# Patient Record
Sex: Male | Born: 2000 | Race: Black or African American | Hispanic: No | Marital: Single | Smoking: Never smoker
Health system: Southern US, Community
[De-identification: ages and names within clinical notes are randomized; demographics above are authoritative.]

## PROBLEM LIST (undated history)

## (undated) ENCOUNTER — Emergency Department (HOSPITAL_COMMUNITY): Admission: EM | Payer: Medicaid Other | Source: Home / Self Care

## (undated) DIAGNOSIS — T7840XA Allergy, unspecified, initial encounter: Secondary | ICD-10-CM

## (undated) HISTORY — DX: Allergy, unspecified, initial encounter: T78.40XA

---
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
## 2020-03-14 ENCOUNTER — Other Ambulatory Visit: Payer: Self-pay

## 2020-03-14 ENCOUNTER — Emergency Department (HOSPITAL_COMMUNITY)
Admission: EM | Admit: 2020-03-14 | Discharge: 2020-03-14 | Disposition: A | Discharge status: Home / Self Care | Payer: Medicaid Other | Attending: Emergency Medicine | Admitting: Emergency Medicine

## 2020-03-14 ENCOUNTER — Encounter (HOSPITAL_COMMUNITY): Payer: Self-pay | Admitting: *Deleted

## 2020-03-14 DIAGNOSIS — M791 Myalgia, unspecified site: Secondary | ICD-10-CM | POA: Diagnosis present

## 2020-03-14 DIAGNOSIS — R05 Cough: Secondary | ICD-10-CM | POA: Diagnosis not present

## 2020-03-14 DIAGNOSIS — Z5321 Procedure and treatment not carried out due to patient leaving prior to being seen by health care provider: Secondary | ICD-10-CM | POA: Diagnosis not present

## 2020-03-14 DIAGNOSIS — R509 Fever, unspecified: Secondary | ICD-10-CM | POA: Insufficient documentation

## 2020-03-14 NOTE — ED Triage Notes (Signed)
Pt states he thinks he had covid x 2 days ago with muscle aches and cough and fever; pt denies any sx now

## 2021-06-30 ENCOUNTER — Other Ambulatory Visit: Payer: Self-pay

## 2021-06-30 ENCOUNTER — Ambulatory Visit
Admission: EM | Admit: 2021-06-30 | Discharge: 2021-06-30 | Disposition: A | Discharge status: Home / Self Care | Payer: Medicaid Other | Attending: Emergency Medicine | Admitting: Emergency Medicine

## 2021-06-30 DIAGNOSIS — S46912A Strain of unspecified muscle, fascia and tendon at shoulder and upper arm level, left arm, initial encounter: Secondary | ICD-10-CM

## 2021-06-30 DIAGNOSIS — S46812A Strain of other muscles, fascia and tendons at shoulder and upper arm level, left arm, initial encounter: Secondary | ICD-10-CM

## 2021-06-30 MED ORDER — IBUPROFEN 600 MG PO TABS: 600.0000 mg | ORAL_TABLET | Freq: Four times a day (QID) | ORAL | 0 refills | Status: AC | PRN

## 2021-06-30 MED ORDER — TIZANIDINE HCL 4 MG PO TABS: 4.0000 mg | ORAL_TABLET | Freq: Three times a day (TID) | ORAL | 0 refills | Status: AC | PRN

## 2021-06-30 NOTE — Discharge Instructions (Signed)
Take 1000 mg of Tylenol combined with 600 mg of ibuprofen, ice, Zanaflex for muscle spasms, stretching, deep tissue massage.

## 2021-06-30 NOTE — ED Triage Notes (Signed)
Patient presents to Urgent Care with complaints of chronic collar bone pain today. Pt states this is an on-going issue. He broke his collar bone 5 years ago. Not treating pain with meds.

## 2021-06-30 NOTE — ED Provider Notes (Signed)
HPI  SUBJECTIVE:  [name] [name] is a right-handed 20 y.o. male who presents with distal left clavicle pain starting today at 1600 after carrying his son.  He has had symptoms like this before after fracturing his clavicle 5 years ago.  He states that he has had intermittent pain in this area since.  No recent trauma, shoulder deformity, limitation of motion of the shoulder, change in physical activity, distal numbness or tingling, grip weakness, neck, trapezial pain.  He has tried moving his arm around without improvement in his symptoms.  Symptoms are worse with forward flexion, shrugging his shoulder.  He has a past medical history of left clavicular fracture which he states healed on its own.  PMD: Dr. Carmel Sacramento   Past Medical History:  Diagnosis Date   Allergy     History reviewed. No pertinent surgical history.  Family History  Problem Relation Age of Onset   Hypertension Mother    Diabetes Mother    Cancer Maternal Grandmother    Stroke Maternal Grandfather    Heart disease Maternal Grandfather     Social History   Tobacco Use   Smoking status: Never   Smokeless tobacco: Never  Vaping Use   Vaping Use: Never used  Substance Use Topics   Alcohol use: No   Drug use: No    No current facility-administered medications for this encounter.  Current Outpatient Medications:    ibuprofen (ADVIL) 600 MG tablet, Take 1 tablet (600 mg total) by mouth every 6 (six) hours as needed., Disp: 30 tablet, Rfl: 0   tiZANidine (ZANAFLEX) 4 MG tablet, Take 1 tablet (4 mg total) by mouth every 8 (eight) hours as needed for muscle spasms., Disp: 30 tablet, Rfl: 0   albuterol (PROVENTIL HFA;VENTOLIN HFA) 108 (90 BASE) MCG/ACT inhaler, Inhale 1-2 puffs into the lungs every 6 (six) hours as needed for wheezing or shortness of breath., Disp: 1 Inhaler, Rfl: 0   cetirizine (ZYRTEC) 10 MG tablet, Take 1 tablet (10 mg total) by mouth daily., Disp: 30 tablet, Rfl: 11   fluticasone (FLONASE) 50  MCG/ACT nasal spray, Place 2 sprays into both nostrils daily., Disp: 16 g, Rfl: 5  No Known Allergies   ROS  As noted in HPI.   Physical Exam  BP 113/79 (BP Location: Right Arm)   Pulse 97   Temp 98.3 F (36.8 C) (Temporal)   Resp 16   SpO2 95%   Constitutional: Well developed, well nourished, no acute distress Eyes:  EOMI, conjunctiva normal bilaterally HENT: Normocephalic, atraumatic,mucus membranes moist Respiratory: Normal inspiratory effort Cardiovascular: Normal rate GI: nondistended skin: No rash, skin intact Musculoskeletal: L shoulder with ROM normal, Drop test normal, distal clavicle tender, A/C joint tender,  proximal humerus NT , Motor strength normal, Sensation intact LT over deltoid region, distal NVI with hand having intact sensation and strength in the distribution of the median, radial, and ulnar nerve. no pain with internal rotation, no pain with external rotation, negative tenderness in bicipital groove, positive empty can test, negative liftoff test, no instability with abduction/external rotation.  Positive tenderness along left trapezius.  RP 2+  Neurologic: Alert & oriented x 3, no focal neuro deficits Psychiatric: Speech and behavior appropriate   ED Course   Medications - No data to display  No orders of the defined types were placed in this encounter.   No results found for this or any previous visit (from the past 24 hour(s)). No results found.  ED Clinical Impression  1. Strain of acromioclavicular joint, left, initial encounter   2. Strain of left trapezius muscle, initial encounter      ED Assessment/Plan  Suspect that patient strained his AC joint/trapezius by carrying his son today.  Do not think that we need imaging in the absence of direct trauma or limited range of motion.  Will send home with Tylenol/ibuprofen, ice, Zanaflex, stretching, deep tissue massage.  Follow-up with orthopedics if not better with conservative treatment in 10  days  Discussed MDM, treatment plan, and plan for follow-up with patient.  patient agrees with plan.   Meds ordered this encounter  Medications   tiZANidine (ZANAFLEX) 4 MG tablet    Sig: Take 1 tablet (4 mg total) by mouth every 8 (eight) hours as needed for muscle spasms.    Dispense:  30 tablet    Refill:  0   ibuprofen (ADVIL) 600 MG tablet    Sig: Take 1 tablet (600 mg total) by mouth every 6 (six) hours as needed.    Dispense:  30 tablet    Refill:  0      *This clinic note was created using Scientist, clinical (histocompatibility and immunogenetics). Therefore, there may be occasional mistakes despite careful proofreading.  ?    Domenick Gong, MD 07/02/21 1456

## 2022-01-21 IMAGING — US US SCROTUM W/ DOPPLER COMPLETE
1 series · 13 of 25 positions shown · non-contrast
Comparison: None.

CLINICAL DATA: 18-year-old presenting with acute onset of RIGHT
scrotal pain that began approximately 6 hours prior to emergency
department arrival.

EXAM:
SCROTAL ULTRASOUND
DOPPLER ULTRASOUND OF THE TESTICLES
TECHNIQUE: Complete ultrasound examination of the testicles, epididymis, and
other scrotal structures was performed. Color and spectral Doppler
ultrasound were also utilized to evaluate blood flow to the
testicles.

[Series 1: us scrotum w/ doppler complete · 0.06mm/px · 13 of 105 slices shown]
[im 1/105]
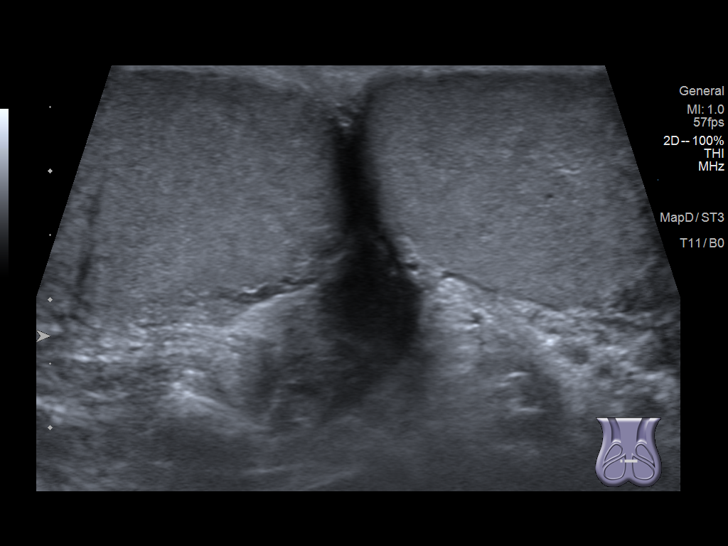
[im 9/105]
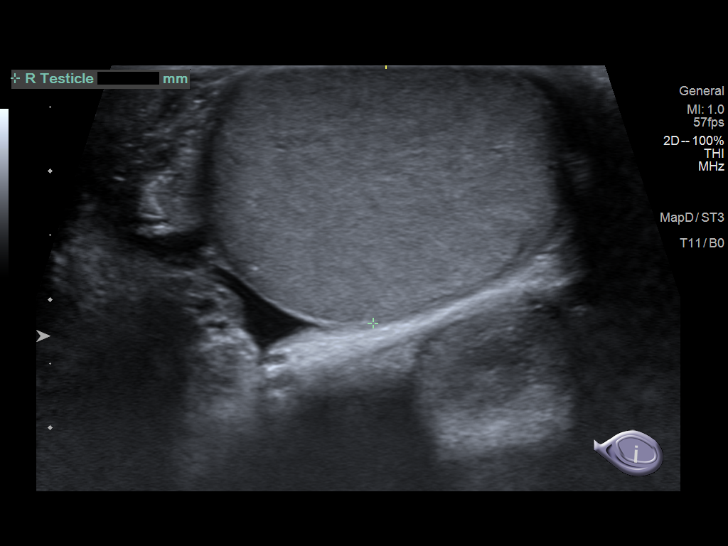
[im 18/105]
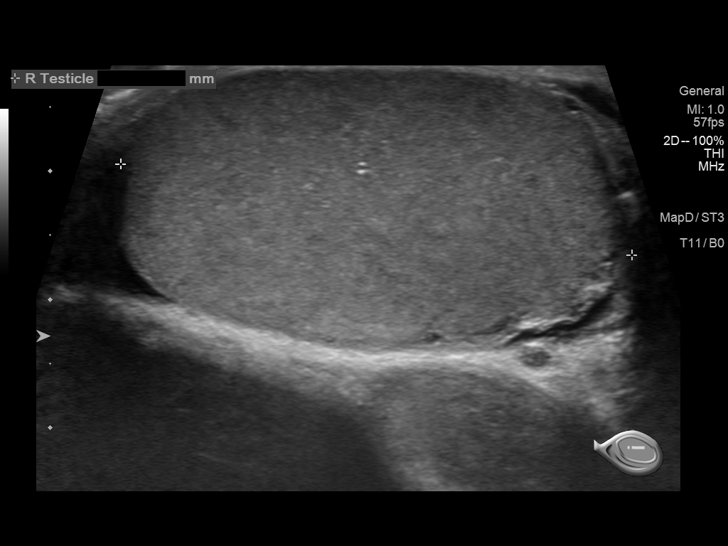
[im 27/105]
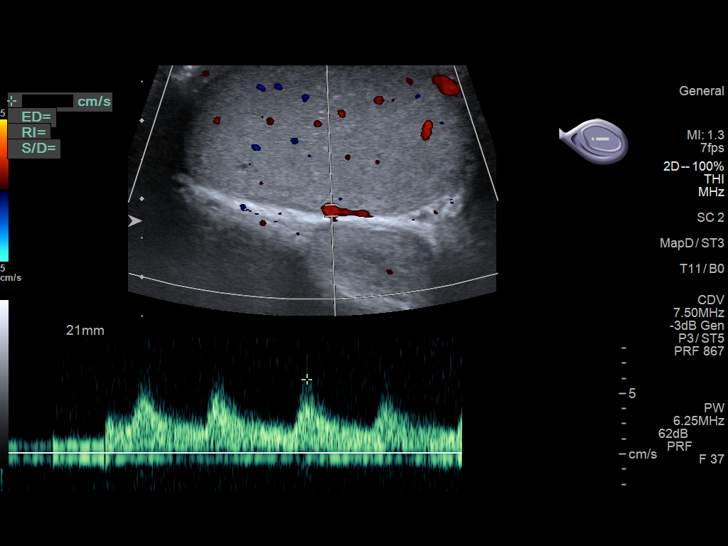
[im 35/105]
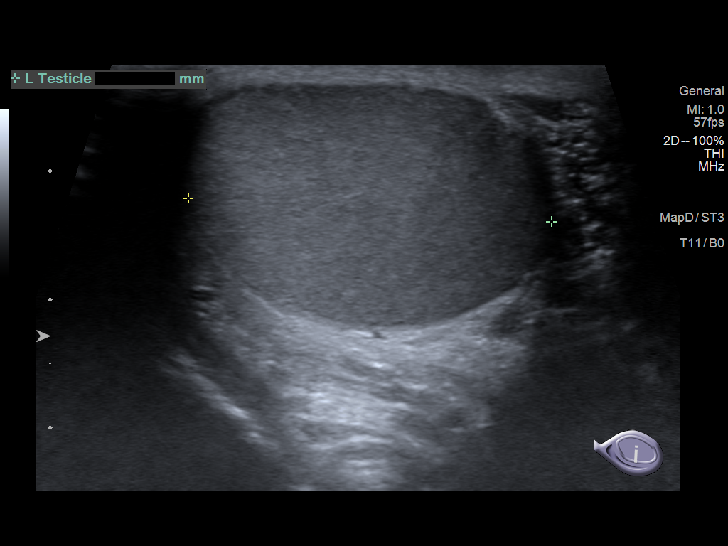
[im 44/105]
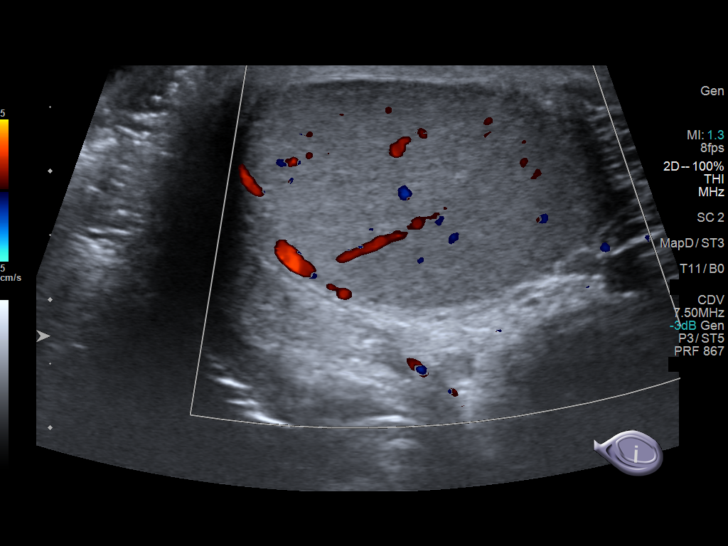
[im 53/105]
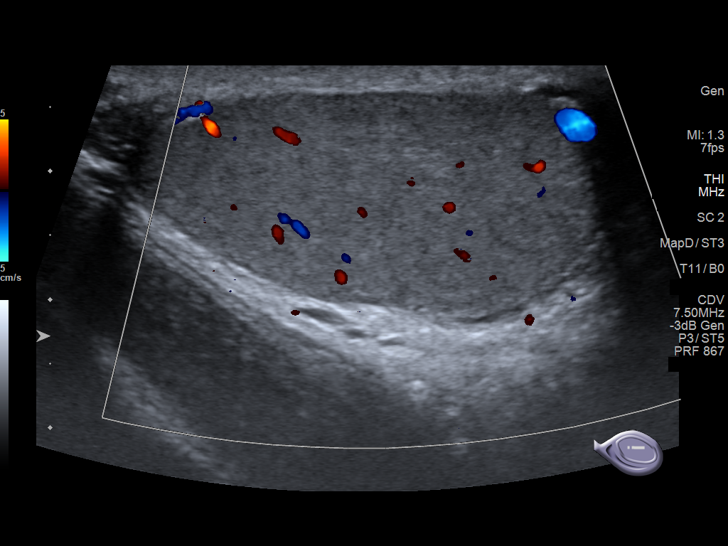
[im 61/105]
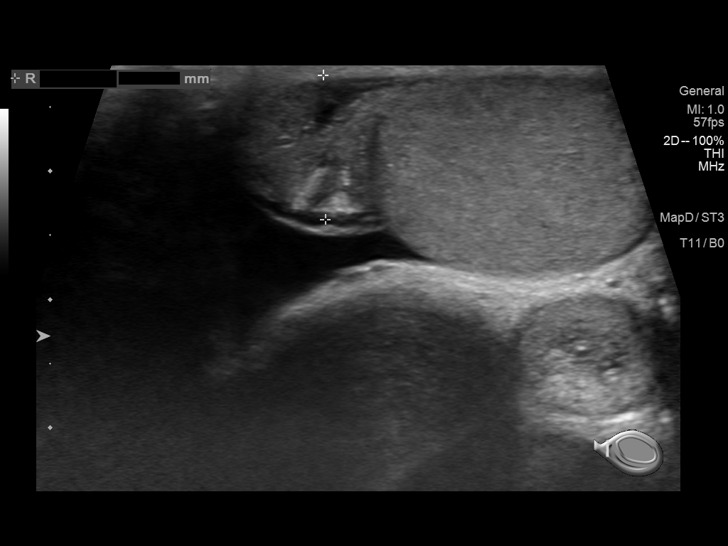
[im 70/105]
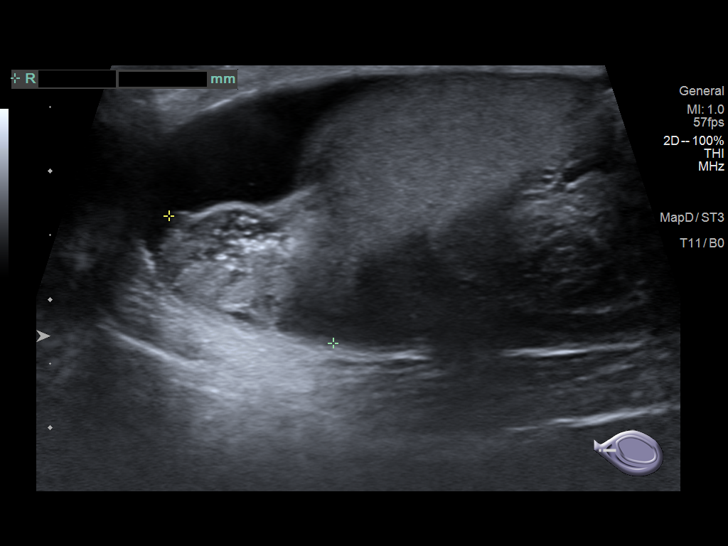
[im 79/105]
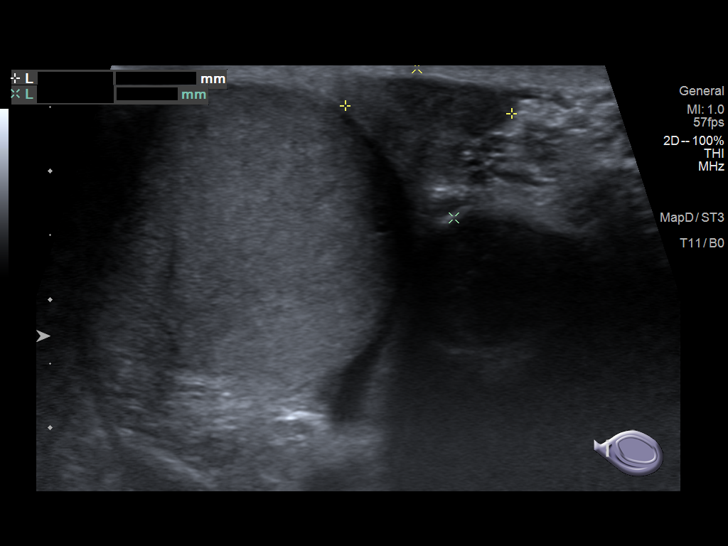
[im 87/105]
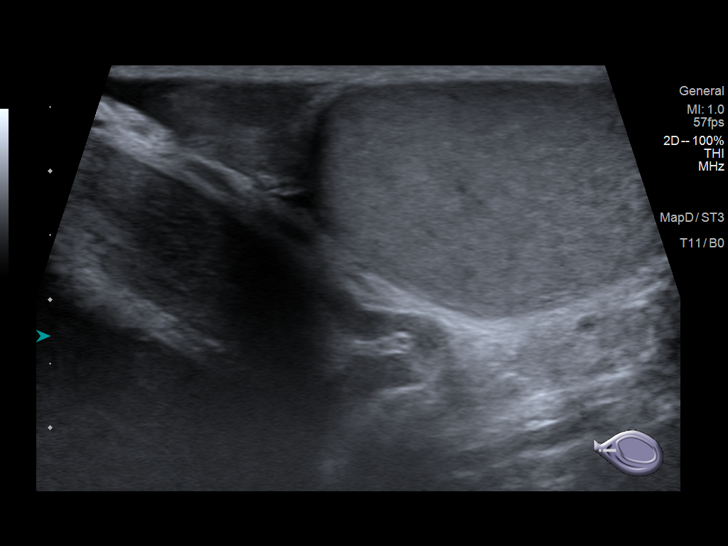
[im 96/105]
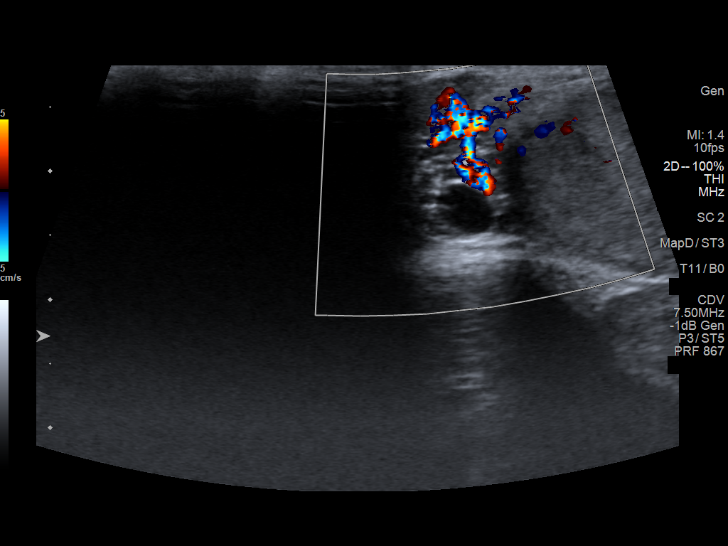
[im 105/105]
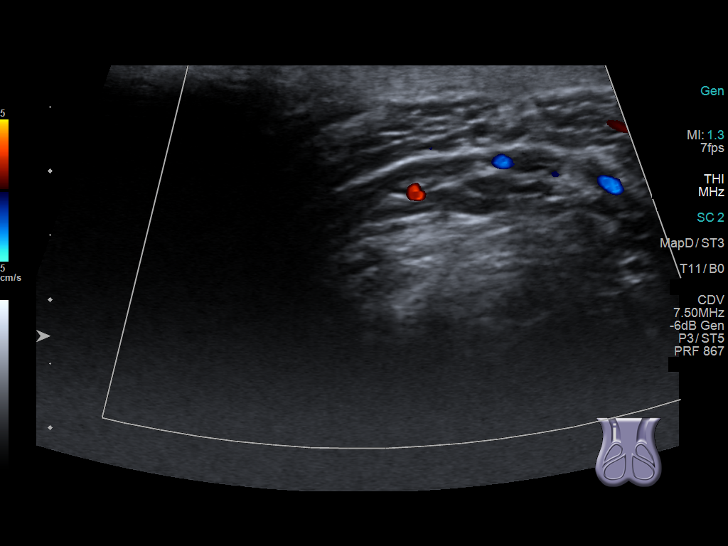

[13 of 25 positions shown; findings below may reference images not displayed]

FINDINGS: Right testicle

Measurements: Approximately 4.0 x 2.0 x 2.7 cm. Normal parenchymal
echotexture without mass or microlithiasis. Normal color Doppler
flow without evidence of hyperemia.

Left testicle

Measurements: Approximately 4.2 x 2.0 x 2.8 cm. Normal parenchymal
echotexture without mass or microlithiasis. Normal color Doppler
flow without evidence of hyperemia.

Right epididymis: Enlarged with marked hyperemia on color Doppler
evaluation. 5 mm cyst involving epididymal head.

Left epididymis: Normal in size and appearance without evidence of
hyperemia.

Hydrocele:  Small RIGHT hydrocele.  No LEFT hydrocele.

Varicocele:  Absent bilaterally.

Pulsed Doppler interrogation of both testes demonstrates normal low
resistance arterial and venous waveforms bilaterally.
IMPRESSION: 1. RIGHT epididymitis.
2. Benign 5 mm cyst involving the RIGHT epididymal head.
3. Otherwise normal examination.  No evidence of testicular torsion.

## 2023-05-30 ENCOUNTER — Other Ambulatory Visit: Payer: Self-pay

## 2023-05-30 ENCOUNTER — Encounter (HOSPITAL_COMMUNITY)
Admission: EM | Disposition: A | Discharge status: Home / Self Care | Payer: Self-pay | Source: Home / Self Care | Attending: Emergency Medicine

## 2023-05-30 ENCOUNTER — Emergency Department (HOSPITAL_BASED_OUTPATIENT_CLINIC_OR_DEPARTMENT_OTHER): Payer: Medicaid Other | Admitting: Critical Care Medicine

## 2023-05-30 ENCOUNTER — Ambulatory Visit (HOSPITAL_COMMUNITY)
Admission: EM | Admit: 2023-05-30 | Discharge: 2023-05-30 | Disposition: A | Discharge status: Home / Self Care | Payer: Medicaid Other | Attending: Emergency Medicine | Admitting: Emergency Medicine

## 2023-05-30 ENCOUNTER — Emergency Department (HOSPITAL_COMMUNITY): Payer: Medicaid Other | Admitting: Critical Care Medicine

## 2023-05-30 ENCOUNTER — Encounter (HOSPITAL_COMMUNITY): Payer: Self-pay

## 2023-05-30 ENCOUNTER — Emergency Department (HOSPITAL_COMMUNITY): Payer: Medicaid Other

## 2023-05-30 DIAGNOSIS — N4400 Torsion of testis, unspecified: Secondary | ICD-10-CM | POA: Insufficient documentation

## 2023-05-30 DIAGNOSIS — N433 Hydrocele, unspecified: Secondary | ICD-10-CM | POA: Insufficient documentation

## 2023-05-30 HISTORY — PX: TESTICLE TORSION REDUCTION: SHX795

## 2023-05-30 LAB — CBC WITH DIFFERENTIAL/PLATELET
Abs Immature Granulocytes: 0.04 10*3/uL (ref 0.00–0.07)
Basophils Absolute: 0 10*3/uL (ref 0.0–0.1)
Basophils Relative: 0 %
Eosinophils Absolute: 0 10*3/uL (ref 0.0–0.5)
Eosinophils Relative: 0 %
HCT: 43.9 % (ref 39.0–52.0)
Hemoglobin: 14.9 g/dL (ref 13.0–17.0)
Immature Granulocytes: 0 %
Lymphocytes Relative: 4 %
Lymphs Abs: 0.4 10*3/uL — ABNORMAL LOW (ref 0.7–4.0)
MCH: 29.7 pg (ref 26.0–34.0)
MCHC: 33.9 g/dL (ref 30.0–36.0)
MCV: 87.5 fL (ref 80.0–100.0)
Monocytes Absolute: 0.2 10*3/uL (ref 0.1–1.0)
Monocytes Relative: 2 %
Neutro Abs: 10.6 10*3/uL — ABNORMAL HIGH (ref 1.7–7.7)
Neutrophils Relative %: 94 %
Platelets: 312 10*3/uL (ref 150–400)
RBC: 5.02 MIL/uL (ref 4.22–5.81)
RDW: 12.1 % (ref 11.5–15.5)
WBC: 11.3 10*3/uL — ABNORMAL HIGH (ref 4.0–10.5)
nRBC: 0 % (ref 0.0–0.2)

## 2023-05-30 LAB — COMPREHENSIVE METABOLIC PANEL
ALT: 13 U/L (ref 0–44)
AST: 17 U/L (ref 15–41)
Albumin: 4.3 g/dL (ref 3.5–5.0)
Alkaline Phosphatase: 57 U/L (ref 38–126)
Anion gap: 12 (ref 5–15)
BUN: 8 mg/dL (ref 6–20)
CO2: 24 mmol/L (ref 22–32)
Calcium: 9.6 mg/dL (ref 8.9–10.3)
Chloride: 104 mmol/L (ref 98–111)
Creatinine, Ser: 0.91 mg/dL (ref 0.61–1.24)
GFR, Estimated: >60 mL/min (ref >60)
Glucose, Bld: 113 mg/dL — ABNORMAL HIGH (ref 70–99)
Potassium: 3.9 mmol/L (ref 3.5–5.1)
Sodium: 140 mmol/L (ref 135–145)
Total Bilirubin: 1.2 mg/dL (ref 0.3–1.2)
Total Protein: 7.8 g/dL (ref 6.5–8.1)

## 2023-05-30 LAB — URINALYSIS, ROUTINE W REFLEX MICROSCOPIC
Bacteria, UA: NONE SEEN
Bilirubin Urine: NEGATIVE
Glucose, UA: 50 mg/dL — AB
Hgb urine dipstick: NEGATIVE
Ketones, ur: 20 mg/dL — AB
Leukocytes,Ua: NEGATIVE
Nitrite: NEGATIVE
Protein, ur: 30 mg/dL — AB
Specific Gravity, Urine: 1.024 (ref 1.005–1.030)
pH: 7 (ref 5.0–8.0)

## 2023-05-30 LAB — LIPASE, BLOOD: Lipase: 19 U/L (ref 11–51)

## 2023-05-30 SURGERY — TESTICULAR TORSION REPAIR
Anesthesia: General | Site: Scrotum | Laterality: Right

## 2023-05-30 MED ORDER — PROPOFOL 10 MG/ML IV BOLUS
INTRAVENOUS | Status: AC
  Filled 2023-05-30: qty 20

## 2023-05-30 MED ORDER — PHENYLEPHRINE 80 MCG/ML (10ML) SYRINGE FOR IV PUSH (FOR BLOOD PRESSURE SUPPORT)
PREFILLED_SYRINGE | INTRAVENOUS | Status: AC
  Filled 2023-05-30: qty 30

## 2023-05-30 MED ORDER — MIDAZOLAM HCL 5 MG/5ML IJ SOLN
INTRAMUSCULAR | Status: DC | PRN
  Administered 2023-05-30: 2 mg via INTRAVENOUS

## 2023-05-30 MED ORDER — MIDAZOLAM HCL 2 MG/2ML IJ SOLN
INTRAMUSCULAR | Status: AC
  Filled 2023-05-30: qty 2

## 2023-05-30 MED ORDER — OXYCODONE HCL 5 MG/5ML PO SOLN: 5.0000 mg | Freq: Once | ORAL | Status: DC | PRN

## 2023-05-30 MED ORDER — ONDANSETRON HCL 4 MG/2ML IJ SOLN
4.0000 mg | Freq: Once | INTRAMUSCULAR | Status: AC
  Administered 2023-05-30: 4 mg via INTRAVENOUS
  Filled 2023-05-30: qty 2

## 2023-05-30 MED ORDER — SODIUM CHLORIDE 0.9 % IV SOLN: 12.5000 mg | INTRAVENOUS | Status: DC | PRN

## 2023-05-30 MED ORDER — HYDROCODONE-ACETAMINOPHEN 5-325 MG PO TABS: 1.0000 | ORAL_TABLET | Freq: Four times a day (QID) | ORAL | 0 refills | Status: AC | PRN

## 2023-05-30 MED ORDER — OXYCODONE HCL 5 MG PO TABS
5.0000 mg | ORAL_TABLET | Freq: Four times a day (QID) | ORAL | 0 refills | Status: AC | PRN
Start: 2023-05-30 — End: 2023-06-02

## 2023-05-30 MED ORDER — AMISULPRIDE (ANTIEMETIC) 5 MG/2ML IV SOLN: 10.0000 mg | Freq: Once | INTRAVENOUS | Status: DC | PRN

## 2023-05-30 MED ORDER — PROPOFOL 10 MG/ML IV BOLUS
INTRAVENOUS | Status: DC | PRN
  Administered 2023-05-30: 200 mg via INTRAVENOUS

## 2023-05-30 MED ORDER — DEXMEDETOMIDINE HCL IN NACL 80 MCG/20ML IV SOLN
INTRAVENOUS | Status: DC | PRN
Start: 2023-05-30 — End: 2023-05-30
  Administered 2023-05-30 (×2): 8 ug via INTRAVENOUS
  Administered 2023-05-30: 4 ug via INTRAVENOUS

## 2023-05-30 MED ORDER — ROCURONIUM BROMIDE 10 MG/ML (PF) SYRINGE
PREFILLED_SYRINGE | INTRAVENOUS | Status: AC
  Filled 2023-05-30: qty 10

## 2023-05-30 MED ORDER — OXYCODONE HCL 5 MG PO TABS: 5.0000 mg | ORAL_TABLET | Freq: Once | ORAL | Status: DC | PRN

## 2023-05-30 MED ORDER — DEXAMETHASONE SODIUM PHOSPHATE 10 MG/ML IJ SOLN
INTRAMUSCULAR | Status: DC | PRN
  Administered 2023-05-30: 4 mg via INTRAVENOUS

## 2023-05-30 MED ORDER — FENTANYL CITRATE (PF) 100 MCG/2ML IJ SOLN: 25.0000 ug | INTRAMUSCULAR | Status: DC | PRN

## 2023-05-30 MED ORDER — SUCCINYLCHOLINE CHLORIDE 200 MG/10ML IV SOSY
PREFILLED_SYRINGE | INTRAVENOUS | Status: DC | PRN
  Administered 2023-05-30: 140 mg via INTRAVENOUS

## 2023-05-30 MED ORDER — BACITRACIN ZINC 500 UNIT/GM EX OINT
TOPICAL_OINTMENT | CUTANEOUS | Status: AC
  Filled 2023-05-30: qty 28.35

## 2023-05-30 MED ORDER — HYDROMORPHONE HCL 1 MG/ML IJ SOLN
0.5000 mg | Freq: Once | INTRAMUSCULAR | Status: AC
  Administered 2023-05-30: 0.5 mg via INTRAVENOUS
  Filled 2023-05-30: qty 1

## 2023-05-30 MED ORDER — ONDANSETRON HCL 4 MG/2ML IJ SOLN
INTRAMUSCULAR | Status: DC | PRN
  Administered 2023-05-30: 4 mg via INTRAVENOUS

## 2023-05-30 MED ORDER — BUPIVACAINE HCL (PF) 0.5 % IJ SOLN
INTRAMUSCULAR | Status: DC | PRN
Start: 2023-05-30 — End: 2023-05-30
  Administered 2023-05-30: 10 mL

## 2023-05-30 MED ORDER — EPHEDRINE 5 MG/ML INJ
INTRAVENOUS | Status: AC
  Filled 2023-05-30: qty 5

## 2023-05-30 MED ORDER — 0.9 % SODIUM CHLORIDE (POUR BTL) OPTIME
TOPICAL | Status: DC | PRN
Start: 2023-05-30 — End: 2023-05-30
  Administered 2023-05-30: 1000 mL

## 2023-05-30 MED ORDER — PHENYLEPHRINE 80 MCG/ML (10ML) SYRINGE FOR IV PUSH (FOR BLOOD PRESSURE SUPPORT)
PREFILLED_SYRINGE | INTRAVENOUS | Status: AC
  Filled 2023-05-30: qty 10

## 2023-05-30 MED ORDER — FENTANYL CITRATE (PF) 250 MCG/5ML IJ SOLN
INTRAMUSCULAR | Status: DC | PRN
  Administered 2023-05-30 (×3): 50 ug via INTRAVENOUS

## 2023-05-30 MED ORDER — BUPIVACAINE HCL (PF) 0.5 % IJ SOLN
INTRAMUSCULAR | Status: AC
  Filled 2023-05-30: qty 30

## 2023-05-30 MED ORDER — LIDOCAINE 2% (20 MG/ML) 5 ML SYRINGE
INTRAMUSCULAR | Status: DC | PRN
  Administered 2023-05-30: 60 mg via INTRAVENOUS

## 2023-05-30 MED ORDER — FENTANYL CITRATE (PF) 250 MCG/5ML IJ SOLN
INTRAMUSCULAR | Status: AC
  Filled 2023-05-30: qty 5

## 2023-05-30 MED ORDER — SUCCINYLCHOLINE CHLORIDE 200 MG/10ML IV SOSY
PREFILLED_SYRINGE | INTRAVENOUS | Status: AC
  Filled 2023-05-30: qty 10

## 2023-05-30 MED ORDER — ONDANSETRON HCL 4 MG/2ML IJ SOLN
INTRAMUSCULAR | Status: AC
  Filled 2023-05-30: qty 2

## 2023-05-30 MED ORDER — BACITRACIN ZINC 500 UNIT/GM EX OINT
TOPICAL_OINTMENT | CUTANEOUS | Status: DC | PRN
Start: 2023-05-30 — End: 2023-05-30
  Administered 2023-05-30: 1 via TOPICAL

## 2023-05-30 MED ORDER — LIDOCAINE 2% (20 MG/ML) 5 ML SYRINGE
INTRAMUSCULAR | Status: AC
  Filled 2023-05-30: qty 5

## 2023-05-30 MED ORDER — DEXAMETHASONE SODIUM PHOSPHATE 10 MG/ML IJ SOLN
INTRAMUSCULAR | Status: AC
  Filled 2023-05-30: qty 1

## 2023-05-30 MED ORDER — LACTATED RINGERS IV SOLN
INTRAVENOUS | Status: DC | PRN
Start: 2023-05-30 — End: 2023-05-30

## 2023-05-30 SURGICAL SUPPLY — 29 items
BAG COUNTER SPONGE SURGICOUNT (BAG) IMPLANT
BAG SPNG CNTER NS LX DISP (BAG)
BLADE HEX COATED 2.75 (ELECTRODE) ×1 IMPLANT
BNDG GAUZE DERMACEA FLUFF 4 (GAUZE/BANDAGES/DRESSINGS) ×1 IMPLANT
BNDG GZE DERMACEA 4 6PLY (GAUZE/BANDAGES/DRESSINGS) ×2
COVER SURGICAL LIGHT HANDLE (MISCELLANEOUS) ×1 IMPLANT
ELECT REM PT RETURN 15FT ADLT (MISCELLANEOUS) ×1 IMPLANT
GLOVE BIOGEL M STRL SZ7.5 (GLOVE) ×1 IMPLANT
GOWN STRL REUS W/ TWL XL LVL3 (GOWN DISPOSABLE) ×1 IMPLANT
GOWN STRL REUS W/TWL XL LVL3 (GOWN DISPOSABLE) ×1
KIT BASIN OR (CUSTOM PROCEDURE TRAY) ×1 IMPLANT
NDL HYPO 22X1.5 SAFETY MO (MISCELLANEOUS) IMPLANT
NEEDLE HYPO 22X1.5 SAFETY MO (MISCELLANEOUS) IMPLANT
NS IRRIG 1000ML POUR BTL (IV SOLUTION) ×1 IMPLANT
PACK GENERAL/GYN (CUSTOM PROCEDURE TRAY) ×1 IMPLANT
SUPPORT SCROTAL LG STRP (MISCELLANEOUS) ×1 IMPLANT
SUT CHROMIC 3 0 SH 27 (SUTURE) ×2 IMPLANT
SUT CHROMIC 4 0 RB 1X27 (SUTURE) IMPLANT
SUT PDS PLUS AB 5-0 RB-1 (SUTURE) IMPLANT
SUT SILK 0 TIES 10X30 (SUTURE) IMPLANT
SUT VIC AB 2-0 SH 27 (SUTURE) ×1
SUT VIC AB 2-0 SH 27XBRD (SUTURE) IMPLANT
SUT VIC AB 2-0 UR5 27 (SUTURE) IMPLANT
SUT VIC AB 3-0 SH 27 (SUTURE) ×2
SUT VIC AB 3-0 SH 27X BRD (SUTURE) IMPLANT
SUT VICRYL 0 TIES 12 18 (SUTURE) IMPLANT
SYR CONTROL 10ML LL (SYRINGE) IMPLANT
TOWEL GREEN STERILE (TOWEL DISPOSABLE) ×2 IMPLANT
WATER STERILE IRR 1000ML POUR (IV SOLUTION) ×1 IMPLANT

## 2023-05-30 NOTE — H&P (Signed)
I have been asked to see the patient by Dr. Benjiman Core, for evaluation and management of Right testicular torsion.  History of present illness: 22 year old male presents to the ER with right testicular pain.  Patient woke up this morning with severe pain activity and vomited immediately.  States the pain came on suddenly.  He has not been able to eat the whole day.  He denies any dysuria gross hematuria or problems with urination.   Review of systems: A 12 point comprehensive review of systems was obtained and is negative unless otherwise stated in the history of present illness.  There are no problems to display for this patient.   No current facility-administered medications on file prior to encounter.   Current Outpatient Medications on File Prior to Encounter  Medication Sig Dispense Refill   albuterol (PROVENTIL HFA;VENTOLIN HFA) 108 (90 BASE) MCG/ACT inhaler Inhale 1-2 puffs into the lungs every 6 (six) hours as needed for wheezing or shortness of breath. 1 Inhaler 0   cetirizine (ZYRTEC) 10 MG tablet Take 1 tablet (10 mg total) by mouth daily. 30 tablet 11   fluticasone (FLONASE) 50 MCG/ACT nasal spray Place 2 sprays into both nostrils daily. 16 g 5   ibuprofen (ADVIL) 600 MG tablet Take 1 tablet (600 mg total) by mouth every 6 (six) hours as needed. 30 tablet 0   tiZANidine (ZANAFLEX) 4 MG tablet Take 1 tablet (4 mg total) by mouth every 8 (eight) hours as needed for muscle spasms. 30 tablet 0    Past Medical History:  Diagnosis Date   Allergy     History reviewed. No pertinent surgical history.  Social History   Tobacco Use   Smoking status: Never   Smokeless tobacco: Never  Vaping Use   Vaping status: Never Used  Substance Use Topics   Alcohol use: No   Drug use: No    Family History  Problem Relation Age of Onset   Hypertension Mother    Diabetes Mother    Cancer Maternal Grandmother    Stroke Maternal Grandfather    Heart disease Maternal Grandfather      PE: Vitals:   05/30/23 1738 05/30/23 1739  BP:  128/77  Pulse:  72  Resp:  16  Temp:  98.1 F (36.7 C)  SpO2:  97%  Weight: 88.5 kg   Height: 6\' 2"  (1.88 m)    Patient appears to be in mild distress  patient is alert and oriented x3 Atraumatic normocephalic head No increased work of breathing, no audible wheezes/rhonchi Regular sinus rhythm/rate Abdomen is soft, nontender, nondistended, no CVA or suprapubic tenderness Lower extremities are symmetric without appreciable edema Grossly neurologically intact Cicumcised penis, patent meatus, no masses or lesions on the penis.  Right testicle high riding and tender.  Left testicle no tenderness or abnormalities.  Recent Labs    05/30/23 1800  WBC 11.3*  HGB 14.9  HCT 43.9   No results for input(s): "NA", "K", "CL", "CO2", "GLUCOSE", "BUN", "CREATININE", "CALCIUM" in the last 72 hours. No results for input(s): "LABPT", "INR" in the last 72 hours. No results for input(s): "LABURIN" in the last 72 hours. No results found for this or any previous visit.  Imaging: FINDINGS: Right testicle   Measurements: 4.2 x 2.8 x 2.0 cm, 16.7 mL. Heterogeneously echogenic.   Left testicle   Measurements: 4.0 x 2.3 x 1.9 cm, 12.4 mL. No mass or microlithiasis visualized.   Right epididymis: Asymmetrically enlarged and heterogeneous. Absent flow.   Left  epididymis:  Normal in size and appearance.   Hydrocele:  Small bilateral hydroceles.   Varicocele:  None visualized.   Pulsed Doppler interrogation of both testes demonstrates normal low resistance arterial and venous waveforms in the left testicle and absent flow in the right testicle. Low velocity Doppler tracing in the right testicle is likely artifactual.   IMPRESSION: 1. Findings suspicious for right testicular torsion. 2. Small bilateral hydroceles.  Imp: 22 year old male with right testicular torsion.  Patient history and imaging consistent with this  diagnosis.   Recommendations: Patient will be taken to the OR for right scrotal exploration possible bilateral orchiopexy, and possible right orchiectomy.  We discussed risk benefits and alternatives to procedure including loss of testicle, injury to the contralateral testicle, exploration and finding no testicular torsion.  Also risk of bleeding and infection.  We discussed future fertility risk if one testicle has to be removed. We discussed the goal the surgery was to relieve pain and treat his torsed testicle. Patient agreed and informed consent was obtained.   Thank you for involving me in this patient's care. Please page with any further questions or concerns. Adonis Brook

## 2023-05-30 NOTE — ED Notes (Signed)
Patient transported to OR.

## 2023-05-30 NOTE — ED Provider Notes (Signed)
[place] EMERGENCY DEPARTMENT AT [facility] Provider Note   CSN: [id number] Arrival date & time: 05/30/23  1730     History  Chief Complaint  Patient presents with   Emesis   Abdominal Pain   Testicle Pain    [name] [name] is a 22 y.o. male.   Emesis Associated symptoms: abdominal pain   Abdominal Pain Associated symptoms: vomiting   Testicle Pain Associated symptoms include abdominal pain.  Patient presents with right-sided testicle pain.  Began at 11 this morning.  No dysuria.  Has had nausea and vomiting.  No discharge.     Home Medications Prior to Admission medications   Medication Sig Start Date End Date Taking? Authorizing Provider  albuterol (PROVENTIL HFA;VENTOLIN HFA) 108 (90 BASE) MCG/ACT inhaler Inhale 1-2 puffs into the lungs every 6 (six) hours as needed for wheezing or shortness of breath. 05/12/13   Burgess Amor, PA-C  cetirizine (ZYRTEC) 10 MG tablet Take 1 tablet (10 mg total) by mouth daily. 05/05/14   Babs Sciara, MD  fluticasone (FLONASE) 50 MCG/ACT nasal spray Place 2 sprays into both nostrils daily. 05/05/14   Babs Sciara, MD  ibuprofen (ADVIL) 600 MG tablet Take 1 tablet (600 mg total) by mouth every 6 (six) hours as needed. 06/30/21   Domenick Gong, MD  tiZANidine (ZANAFLEX) 4 MG tablet Take 1 tablet (4 mg total) by mouth every 8 (eight) hours as needed for muscle spasms. 06/30/21   Domenick Gong, MD      Allergies    Patient has no known allergies.    Review of Systems   Review of Systems  Gastrointestinal:  Positive for abdominal pain and vomiting.  Genitourinary:  Positive for testicular pain.    Physical Exam Updated Vital Signs BP 138/74   Pulse 75   Temp 98.1 F (36.7 C)   Resp 16   Ht 6\' 2"  (1.88 m)   Wt 88.5 kg   SpO2 99%   BMI 25.05 kg/m  Physical Exam Vitals and nursing note reviewed.  Abdominal:     Tenderness: There is abdominal tenderness.  Genitourinary:    Comments: Right testicle  firm and high riding.  Tender to palpation. Skin:    Capillary Refill: Capillary refill takes less than 2 seconds.  Neurological:     Mental Status: He is alert and oriented to person, place, and time.     ED Results / Procedures / Treatments   Labs (all labs ordered are listed, but only abnormal results are displayed) Labs Reviewed  COMPREHENSIVE METABOLIC PANEL - Abnormal; Notable for the following components:      Result Value   Glucose, Bld 113 (*)    All other components within normal limits  CBC WITH DIFFERENTIAL/PLATELET - Abnormal; Notable for the following components:   WBC 11.3 (*)    Neutro Abs 10.6 (*)    Lymphs Abs 0.4 (*)    All other components within normal limits  URINALYSIS, ROUTINE W REFLEX MICROSCOPIC - Abnormal; Notable for the following components:   Glucose, UA 50 (*)    Ketones, ur 20 (*)    Protein, ur 30 (*)    All other components within normal limits  LIPASE, BLOOD  RPR  GC/CHLAMYDIA PROBE AMP ([place]) NOT AT [facility]    EKG None  Radiology US SCROTUM W/DOPPLER  Result Date: 05/30/2023 CLINICAL DATA:  Acute onset right testicular pain and swelling associated with nausea, vomiting, and right lower quadrant abdominal pain EXAM: SCROTAL  ULTRASOUND DOPPLER ULTRASOUND OF THE TESTICLES TECHNIQUE: Complete ultrasound examination of the testicles, epididymis, and other scrotal structures was performed. Color and spectral Doppler ultrasound were also utilized to evaluate blood flow to the testicles. COMPARISON:  Ultrasound scrotum dated 12/03/2019 FINDINGS: Right testicle Measurements: 4.2 x 2.8 x 2.0 cm, 16.7 mL. Heterogeneously echogenic. Left testicle Measurements: 4.0 x 2.3 x 1.9 cm, 12.4 mL. No mass or microlithiasis visualized. Right epididymis: Asymmetrically enlarged and heterogeneous. Absent flow. Left epididymis:  Normal in size and appearance. Hydrocele:  Small bilateral hydroceles. Varicocele:  None visualized. Pulsed Doppler interrogation of both  testes demonstrates normal low resistance arterial and venous waveforms in the left testicle and absent flow in the right testicle. Low velocity Doppler tracing in the right testicle is likely artifactual. IMPRESSION: 1. Findings suspicious for right testicular torsion. 2. Small bilateral hydroceles. Critical Value/emergent results were called by telephone at the time of interpretation on 05/30/2023 at 6:40 pm to provider Dr. Rubin Payor, who verbally acknowledged these results. Electronically Signed   By: Agustin Cree M.D.   On: 05/30/2023 18:53    Procedures Procedures    Medications Ordered in ED Medications  HYDROmorphone (DILAUDID) injection 0.5 mg (0.5 mg Intravenous Given 05/30/23 1916)  ondansetron (ZOFRAN) injection 4 mg (4 mg Intravenous Given 05/30/23 1917)    ED Course/ Medical Decision Making/ A&P                                 Medical Decision Making Amount and/or Complexity of Data Reviewed Labs: ordered.  Risk Prescription drug management.   Patient with right-sided testicular pain.  Acute onset at 10 AM.  Called by ultrasound tech.  Saw patient emergently and does have physical exam consistent with testicular torsion.  Discussed with urology, Dr. Jennette Bill.  Will be taken emergently to the OR.  CRITICAL CARE Performed by: Benjiman Core Total critical care time: 30 minutes Critical care time was exclusive of separately billable procedures and treating other patients. Critical care was necessary to treat or prevent imminent or life-threatening deterioration. Critical care was time spent personally by me on the following activities: development of treatment plan with patient and/or surrogate as well as nursing, discussions with consultants, evaluation of patient's response to treatment, examination of patient, obtaining history from patient or surrogate, ordering and performing treatments and interventions, ordering and review of laboratory studies, ordering and review of  radiographic studies, pulse oximetry and re-evaluation of patient's condition.         Final Clinical Impression(s) / ED Diagnoses Final diagnoses:  Testicular torsion    Rx / DC Orders ED Discharge Orders     None         Benjiman Core, MD 05/30/23 1946

## 2023-05-30 NOTE — Op Note (Signed)
Preoperative diagnosis:  Right testicular torsion   Postoperative diagnosis:  Same   Procedure:       1. Scrotal exploration        2. Bilateral orchiopexy   Surgeon: Dr. Vilma Prader  Anesthesia: General  Complications: None  Intraoperative findings: R testicle torsed 360 degrees.  - significant return to color with warming  - 3 point pexy performed for bilateral tesitcles  EBL: 15cc  Specimens: None  Indication: Indication: [name] [name] is a 22 y.o. patient with right testicular torsion.  After reviewing the management options for treatment, he elected to proceed with the above surgical procedure(s). We have discussed the potential benefits and risks of the procedure, side effects of the proposed treatment, the likelihood of the patient achieving the goals of the procedure, and any potential problems that might occur during the procedure or recuperation. Informed consent has been obtained.  Description of procedure:  The patient was taken to the operating room and general anesthesia was induced. The patient was placed on the table in supine position, general anesthesia was then induced. The scrotum was then prepped and draped in the routine sterile fashion. A timeout was then held with confirmation of antibiotics.  An incision was made through right the skin and into the dartos. Once through several layers the dartos was able to get the right testicle and contents out of the right  hemiscrotum and into the surgical field.   The testicle was then exposed it was noted to be edematous and dark.  It was then untwisted 360 degrees and placed in warm saline.  Attention was then turned to the left hemiscrotum where a transverse and chevron incision was made in the left hemiscrotum.  Dissection was taken down using cautery through the dartos and then the tunica vaginalis until the testicle was exposed.  Once testicle was exposed it was evaluated making sure the cord was oriented  appropriately.  5-0 PDS was then used to pexy the testicle at the lateral inferior and medial aspect of the testicle to the dartos.  The test was then put back into the scrotum and the sutures were tied.  After inspection and no bleeding was appreciated the dartos was then closed using 3-0 Vicryl then the skin on the left side was closed using 3-0 chromic in vertical mattresses interrupted.  Attention was then turned to the right testicle.  After the testicles lateral sent in saline there was significant color improvement.  Doppler demonstrated good flow within the cord minimal flow in the testicle if any.  Due to the significant improvement in the color felt like replacing the testicle will be the best option.  Decision was made to pexy.  5-0 PDS suture was then used to pexy this tunica albuginea to the dark toes in the medial inferior and lateral aspects.  Describes were inspected for bleeding all bleeding was cauterized the testicle was placed back into the scrotum.  The sutures were then tied appropriately.  We then confirmed that the cord was in the correct orientation.  After this area was inspected the dartos was then closed using 3-0 Vicryl in a running fashion the skin was then closed with 3-0 Chromic Gut suture in vertical mattress interrupted fashion.  0.5% Marcaine was then injected into the incision sites and they were covered with bacitracin.  A fluff dressing and mesh underpants were then applied area.  The patient tolerated the procedure without any perioperative complications. At the end of the case all  last needles and sponges had been accounted for. The patient was returned to the PACU in excellent condition.   Vilma Prader

## 2023-05-30 NOTE — Discharge Instructions (Addendum)
Orchiepexy: POST OP INSTRUCTIONS  DIET: Follow a light bland diet the first 24 hours after arrival home, such as soup, liquids, crackers, etc.  Be sure to include lots of fluids daily.  Avoid fast food or heavy meals as your are more likely to get nauseated.  Eat a low fat the next few days after surgery. Take your usually prescribed home medications unless otherwise directed. PAIN CONTROL: Pain is best controlled by a usual combination of three different methods TOGETHER: Ice/Heat Over the counter pain medication Prescription pain medication Most patients will experience some swelling and bruising around the incision.  Ice packs or heating pads (30-60 minutes up to 6 times a day) will help. Use ice for the first few days to help decrease swelling and bruising, then switch to heat to help relax tight/sore spots and speed recovery.  Some people prefer to use ice alone, heat alone, alternating between ice & heat.  Experiment to what works for you.  Swelling and bruising can take several weeks to resolve.   It is helpful to take an over-the-counter pain medication regularly for the first few weeks.  You can use these medications together alternating every 4 hours  Ibuprofen (Advil, etc) Three 200mg  tabs four times a day (every meal & bedtime) Acetaminophen (Tylenol, etc) 325-650mg  four times a day (every meal & bedtime) A  prescription for pain medication should be given to you upon discharge.  Take your pain medication as prescribed.  If you are having problems/concerns with the prescription medicine (does not control pain, nausea, vomiting, rash, itching, etc), please call us [phone] to see if we need to switch you to a different pain medicine that will work better for you and/or control your side effect better. If you need a refill on your pain medication, please contactus. Avoid getting constipated.  Between the surgery and the pain medications, it is common to experience some constipation.   Increasing fluid intake and taking a fiber supplement (such as Metamucil, Citrucel, FiberCon, MiraLax, etc) 1-2 times a day regularly will usually help prevent this problem from occurring.  A mild laxative (prune juice, Milk of Magnesia, MiraLax, etc) should be taken according to package directions if there are no bowel movements after 48 hours.   Wash / shower every day.  DO not bath for 48 hours after surgery. After that you may shower and allow soap and water to run over the incisions.  DO NOT SUBMERGE INCISION IN WATER for 2 weeks after surgery. No swimming, baths, or hot tubs  The sutures are dissolvable and will dissolve over time ACTIVITIES as tolerated:   You may resume regular (light) daily activities beginning the next day--such as daily self-care, walking, climbing stairs--gradually increasing activities as tolerated.  If you can walk 30 minutes without difficulty, it is safe to try more intense activity such as jogging, treadmill, bicycling, low-impact aerobics, etc. Save the most intensive and strenuous activity for last such as sit-ups, heavy lifting, contact sports, etc  Refrain from any heavy lifting or straining until you are off narcotics for pain control.   DO NOT PUSH THROUGH PAIN.  Let pain be your guide: If it hurts to do something, don't do it.  Pain is your body warning you to avoid that activity for another week until the pain goes down. You may drive when you are no longer taking prescription pain medication, you can comfortably wear a seatbelt, and you can safely maneuver your car and apply brakes. You may  have sexual intercourse when it is comfortable.  FOLLOW UP in our office Please call [facility] at [phone] to set up an appointment to see your surgeon in the office for a follow-up appointment approximately 2-3 weeks after your surgery, if you have not been already scheduled for follow-up. You will also be called to make an appointment.  Make sure that you call  for this appointment the day you arrive home to insure a convenient appointment time. 9.  IF YOU HAVE DISABILITY OR FAMILY LEAVE FORMS, BRING THEM TO THE OFFICE FOR PROCESSING.  DO NOT GIVE THEM TO YOUR DOCTOR.  WHEN TO CALL us [phone]: Poor pain control Reactions / problems with new medications (rash/itching, nausea, etc)  Fever over 101.5 F (38.5 C) Inability to urinate Nausea and/or vomiting Worsening swelling or bruising Continued bleeding from incision. Increased pain, redness, or drainage from the incision If your scrotum is expanding rapidly over the course of an hour or less    The clinic staff is available to answer your questions during regular business hours (8:30am-5pm).  Please don't hesitate to call and ask to speak to one of our nurses for clinical concerns.   If you have a medical emergency, go to the nearest emergency room or call 911.

## 2023-05-30 NOTE — Consult Note (Signed)
I have been asked to see the patient by Dr. Benjiman Core, for evaluation and management of Right testicular torsion.  History of present illness: 22 year old male presents to the ER with right testicular pain.  Patient woke up this morning with severe pain activity and vomited immediately.  States the pain came on suddenly.  He has not been able to eat the whole day.  He denies any dysuria gross hematuria or problems with urination.   Review of systems: A 12 point comprehensive review of systems was obtained and is negative unless otherwise stated in the history of present illness.  There are no problems to display for this patient.   No current facility-administered medications on file prior to encounter.   Current Outpatient Medications on File Prior to Encounter  Medication Sig Dispense Refill   albuterol (PROVENTIL HFA;VENTOLIN HFA) 108 (90 BASE) MCG/ACT inhaler Inhale 1-2 puffs into the lungs every 6 (six) hours as needed for wheezing or shortness of breath. 1 Inhaler 0   cetirizine (ZYRTEC) 10 MG tablet Take 1 tablet (10 mg total) by mouth daily. 30 tablet 11   fluticasone (FLONASE) 50 MCG/ACT nasal spray Place 2 sprays into both nostrils daily. 16 g 5   ibuprofen (ADVIL) 600 MG tablet Take 1 tablet (600 mg total) by mouth every 6 (six) hours as needed. 30 tablet 0   tiZANidine (ZANAFLEX) 4 MG tablet Take 1 tablet (4 mg total) by mouth every 8 (eight) hours as needed for muscle spasms. 30 tablet 0    Past Medical History:  Diagnosis Date   Allergy     History reviewed. No pertinent surgical history.  Social History   Tobacco Use   Smoking status: Never   Smokeless tobacco: Never  Vaping Use   Vaping status: Never Used  Substance Use Topics   Alcohol use: No   Drug use: No    Family History  Problem Relation Age of Onset   Hypertension Mother    Diabetes Mother    Cancer Maternal Grandmother    Stroke Maternal Grandfather    Heart disease Maternal Grandfather      PE: Vitals:   05/30/23 1738 05/30/23 1739  BP:  128/77  Pulse:  72  Resp:  16  Temp:  98.1 F (36.7 C)  SpO2:  97%  Weight: 88.5 kg   Height: 6\' 2"  (1.88 m)    Patient appears to be in mild distress  patient is alert and oriented x3 Atraumatic normocephalic head No increased work of breathing, no audible wheezes/rhonchi Regular sinus rhythm/rate Abdomen is soft, nontender, nondistended, no CVA or suprapubic tenderness Lower extremities are symmetric without appreciable edema Grossly neurologically intact Cicumcised penis, patent meatus, no masses or lesions on the penis.  Right testicle high riding and tender.  Left testicle no tenderness or abnormalities.  Recent Labs    05/30/23 1800  WBC 11.3*  HGB 14.9  HCT 43.9   No results for input(s): "NA", "K", "CL", "CO2", "GLUCOSE", "BUN", "CREATININE", "CALCIUM" in the last 72 hours. No results for input(s): "LABPT", "INR" in the last 72 hours. No results for input(s): "LABURIN" in the last 72 hours. No results found for this or any previous visit.  Imaging: FINDINGS: Right testicle   Measurements: 4.2 x 2.8 x 2.0 cm, 16.7 mL. Heterogeneously echogenic.   Left testicle   Measurements: 4.0 x 2.3 x 1.9 cm, 12.4 mL. No mass or microlithiasis visualized.   Right epididymis: Asymmetrically enlarged and heterogeneous. Absent flow.   Left  epididymis:  Normal in size and appearance.   Hydrocele:  Small bilateral hydroceles.   Varicocele:  None visualized.   Pulsed Doppler interrogation of both testes demonstrates normal low resistance arterial and venous waveforms in the left testicle and absent flow in the right testicle. Low velocity Doppler tracing in the right testicle is likely artifactual.   IMPRESSION: 1. Findings suspicious for right testicular torsion. 2. Small bilateral hydroceles.  Imp: 22 year old male with right testicular torsion.  Patient history and imaging consistent with this  diagnosis.   Recommendations: Patient will be taken to the OR for right scrotal exploration possible bilateral orchiopexy, and possible right orchiectomy.  We discussed risk benefits and alternatives to procedure including loss of testicle, injury to the contralateral testicle, exploration and finding no testicular torsion.  Also risk of bleeding and infection.  We discussed future fertility risk if one testicle has to be removed. We discussed the goal the surgery was to relieve pain and treat his torsed testicle. Patient agreed and informed consent was obtained.   Thank you for involving me in this patient's care. Please page with any further questions or concerns. Adonis Brook

## 2023-05-30 NOTE — Transfer of Care (Signed)
Immediate Anesthesia Transfer of Care Note  Patient: [name] [name]  Procedure(s) Performed: TESTICULAR TORSION REPAIR (Right: Scrotum)  Patient Location: PACU  Anesthesia Type:General  Level of Consciousness: drowsy  Airway & Oxygen Therapy: Patient Spontanous Breathing and Patient connected to nasal cannula oxygen  Post-op Assessment: Report given to RN and Post -op Vital signs reviewed and stable  Post vital signs: Reviewed and stable  Last Vitals:  Vitals Value Taken Time  BP 118/57 05/30/23 2126  Temp    Pulse 83 05/30/23 2128  Resp 18 05/30/23 2128  SpO2 100 % 05/30/23 2128  Vitals shown include unfiled device data.  Last Pain:  Vitals:   05/30/23 1738  PainSc: 10-Worst pain ever         Complications: No notable events documented.

## 2023-05-30 NOTE — Anesthesia Postprocedure Evaluation (Signed)
Anesthesia Post Note  Patient: [name] [name]  Procedure(s) Performed: TESTICULAR TORSION REPAIR (Right: Scrotum)     Patient location during evaluation: PACU Anesthesia Type: General Level of consciousness: awake and alert Pain management: pain level controlled Vital Signs Assessment: post-procedure vital signs reviewed and stable Respiratory status: spontaneous breathing, nonlabored ventilation and respiratory function stable Cardiovascular status: stable and blood pressure returned to baseline Anesthetic complications: no   No notable events documented.  Last Vitals:  Vitals:   05/30/23 2145 05/30/23 2200  BP:  117/63  Pulse: 82 62  Resp: 16 16  Temp:    SpO2: 100% 98%    Last Pain:  Vitals:   05/30/23 2200  PainSc: 0-No pain                 Beryle Lathe

## 2023-05-30 NOTE — ED Triage Notes (Signed)
Pt states having right testicle pain and swelling, N/V, RLQ abdominal pain started this morning. Pt denies penile discharge.

## 2023-05-30 NOTE — Anesthesia Preprocedure Evaluation (Addendum)
Anesthesia Evaluation  Patient identified by MRN, date of birth, ID band Patient awake    Reviewed: Allergy & Precautions, NPO status , Patient's Chart, lab work & pertinent test results  History of Anesthesia Complications Negative for: history of anesthetic complications  Airway Mallampati: I  TM Distance: >3 FB Neck ROM: Full    Dental  (+) Dental Advisory Given, Chipped,    Pulmonary neg pulmonary ROS   Pulmonary exam normal        Cardiovascular negative cardio ROS Normal cardiovascular exam     Neuro/Psych negative neurological ROS  negative psych ROS   GI/Hepatic negative GI ROS, Neg liver ROS,,,  Endo/Other  negative endocrine ROS    Renal/GU negative Renal ROS     Musculoskeletal negative musculoskeletal ROS (+)    Abdominal   Peds  Hematology negative hematology ROS (+)   Anesthesia Other Findings   Reproductive/Obstetrics                             Anesthesia Physical Anesthesia Plan  ASA: 1 and emergent  Anesthesia Plan: General   Post-op Pain Management: Ofirmev IV (intra-op)*   Induction: Intravenous, Rapid sequence and Cricoid pressure planned  PONV Risk Score and Plan: 2 and Treatment may vary due to age or medical condition, Ondansetron, Dexamethasone and Midazolam  Airway Management Planned: Oral ETT  Additional Equipment: None  Intra-op Plan:   Post-operative Plan: Extubation in OR  Informed Consent: I have reviewed the patients History and Physical, chart, labs and discussed the procedure including the risks, benefits and alternatives for the proposed anesthesia with the patient or authorized representative who has indicated his/her understanding and acceptance.     Dental advisory given  Plan Discussed with: CRNA and Anesthesiologist  Anesthesia Plan Comments:        Anesthesia Quick Evaluation

## 2023-05-30 NOTE — Anesthesia Procedure Notes (Signed)
Procedure Name: Intubation Date/Time: 05/30/2023 8:05 PM  Performed by: Rachel Moulds, CRNAPre-anesthesia Checklist: Patient identified, Emergency Drugs available, Suction available, Patient being monitored and Timeout performed Patient Re-evaluated:Patient Re-evaluated prior to induction Oxygen Delivery Method: Circle system utilized Preoxygenation: Pre-oxygenation with 100% oxygen Induction Type: IV induction, Rapid sequence and Cricoid Pressure applied Laryngoscope Size: 4 and Mac Tube type: Oral Tube size: 7.5 mm Number of attempts: 1 Airway Equipment and Method: Stylet Placement Confirmation: ETT inserted through vocal cords under direct vision, positive ETCO2, CO2 detector and breath sounds checked- equal and bilateral Secured at: 22 cm Tube secured with: Tape Dental Injury: Teeth and Oropharynx as per pre-operative assessment

## 2023-05-31 ENCOUNTER — Encounter (HOSPITAL_COMMUNITY): Payer: Self-pay | Admitting: Urology

## 2023-05-31 LAB — GC/CHLAMYDIA PROBE AMP (~~LOC~~) NOT AT ARMC
Chlamydia: NEGATIVE
Comment: NEGATIVE
Comment: NORMAL
Neisseria Gonorrhea: NEGATIVE

## 2023-05-31 LAB — RPR: RPR Ser Ql: NONREACTIVE

## 2023-06-24 ENCOUNTER — Emergency Department (HOSPITAL_COMMUNITY)
Admission: EM | Admit: 2023-06-24 | Discharge: 2023-06-24 | Discharge status: Against Medical Advice | Payer: Medicaid Other

## 2023-06-24 NOTE — ED Triage Notes (Signed)
No answer when called to triage x 3

## 2023-06-24 NOTE — ED Notes (Signed)
NA for triage

## 2023-06-25 ENCOUNTER — Encounter (HOSPITAL_COMMUNITY): Payer: Self-pay

## 2023-06-25 ENCOUNTER — Emergency Department (HOSPITAL_COMMUNITY)
Admission: EM | Admit: 2023-06-25 | Discharge: 2023-06-25 | Disposition: A | Discharge status: Home / Self Care | Payer: Medicaid Other | Attending: Emergency Medicine | Admitting: Emergency Medicine

## 2023-06-25 ENCOUNTER — Other Ambulatory Visit: Payer: Self-pay

## 2023-06-25 DIAGNOSIS — N492 Inflammatory disorders of scrotum: Secondary | ICD-10-CM | POA: Insufficient documentation

## 2023-06-25 MED ORDER — DOXYCYCLINE HYCLATE 100 MG PO CAPS: 100.0000 mg | ORAL_CAPSULE | Freq: Two times a day (BID) | ORAL | 0 refills | Status: AC

## 2023-06-25 MED ORDER — ACETAMINOPHEN 500 MG PO TABS
1000.0000 mg | ORAL_TABLET | Freq: Once | ORAL | Status: AC
  Administered 2023-06-25: 1000 mg via ORAL
  Filled 2023-06-25: qty 2

## 2023-06-25 MED ORDER — LIDOCAINE-EPINEPHRINE (PF) 2 %-1:200000 IJ SOLN
20.0000 mL | Freq: Once | INTRAMUSCULAR | Status: AC
  Administered 2023-06-25: 20 mL
  Filled 2023-06-25: qty 20

## 2023-06-25 MED ORDER — IBUPROFEN 400 MG PO TABS
400.0000 mg | ORAL_TABLET | Freq: Once | ORAL | Status: AC
  Administered 2023-06-25: 400 mg via ORAL
  Filled 2023-06-25: qty 1

## 2023-06-25 MED ORDER — DOXYCYCLINE HYCLATE 100 MG PO TABS
100.0000 mg | ORAL_TABLET | Freq: Once | ORAL | Status: AC
  Administered 2023-06-25: 100 mg via ORAL
  Filled 2023-06-25: qty 1

## 2023-06-25 NOTE — ED Triage Notes (Signed)
Pt c/o pain that radiates from right testicle to right groinx2-3d. Pt states there is a lump on his testicle. Pt denies penile discharge. Pt denies urinary issues.

## 2023-06-25 NOTE — Discharge Instructions (Addendum)
It was our pleasure to provide your ER care today - we hope that you feel better.  Keep area very clean. May shower, pad area gently dry.   Take doxycycline (antibiotic) as prescribed. Take acetaminophen or ibuprofen as need.   Follow up with your doctor, or urgent care, in two days time for recheck and packing removal.   Return to ER if worse, new symptoms, fevers, spreading redness, severe pain, or other concern.

## 2023-06-25 NOTE — ED Provider Notes (Signed)
[place] EMERGENCY DEPARTMENT AT [facility] Provider Note   CSN: [id number] Arrival date & time: 06/25/23  1038     History  Chief Complaint  Patient presents with   Abscess    [name] [name] is a 22 y.o. male.  Pt with c/o area of pain and swelling to right lateral scrotum in past few days. 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 Had hx testicular torsion and surgery for same ~ 1 month ago - does not  feel same - no pain in testicle. Pt denies abd pain. No fever or chills.   The history is provided by the patient and medical records.  Testicle Pain Pertinent negatives include no abdominal pain.       Home Medications Prior to Admission medications   Medication Sig Start Date End Date Taking? Authorizing Provider  doxycycline (VIBRAMYCIN) 100 MG capsule Take 1 capsule (100 mg total) by mouth 2 (two) times daily. 06/25/23  Yes Cathren Laine, MD  albuterol (PROVENTIL HFA;VENTOLIN HFA) 108 (90 BASE) MCG/ACT inhaler Inhale 1-2 puffs into the lungs every 6 (six) hours as needed for wheezing or shortness of breath. 05/12/13   Burgess Amor, PA-C  cetirizine (ZYRTEC) 10 MG tablet Take 1 tablet (10 mg total) by mouth daily. 05/05/14   Babs Sciara, MD  fluticasone (FLONASE) 50 MCG/ACT nasal spray Place 2 sprays into both nostrils daily. 05/05/14   Babs Sciara, MD  ibuprofen (ADVIL) 600 MG tablet Take 1 tablet (600 mg total) by mouth every 6 (six) hours as needed. 06/30/21   Domenick Gong, MD  tiZANidine (ZANAFLEX) 4 MG tablet Take 1 tablet (4 mg total) by mouth every 8 (eight) hours as needed for muscle spasms. 06/30/21   Domenick Gong, MD      Allergies    Patient has no known allergies.    Review of Systems   Review of Systems  Constitutional:  Negative for chills and fever.  Gastrointestinal:  Negative for abdominal pain, nausea and vomiting.  Genitourinary:   Negative for dysuria and testicular pain.    Physical Exam Updated Vital Signs BP 122/79 (BP Location: Right Arm)   Pulse 72   Temp 98.3 F (36.8 C) (Oral)   Resp 17   Ht 1.88 m (6\' 2" )   Wt 88.5 kg   SpO2 98%   BMI 25.05 kg/m  Physical Exam Vitals and nursing note reviewed.  Constitutional:      Appearance: Normal appearance. He is well-developed.  HENT:     Head: Atraumatic.  Eyes:     General: No scleral icterus.    Conjunctiva/sclera: Conjunctivae normal.  Neck:     Trachea: No tracheal deviation.  Cardiovascular:     Rate and Rhythm: Normal rate.     Pulses: Normal pulses.  Pulmonary:     Effort: Pulmonary effort is normal. No accessory muscle usage or respiratory distress.  Abdominal:     General: There is no distension.     Palpations: Abdomen is soft.     Tenderness: There is no abdominal tenderness.  Genitourinary:    Comments: Pt with 2-3 cm abscess to right lateral scrotum, is spontaneously draining small amount pus via pinhole-sized opening in skin. No crepitus.  No testicle swelling/pain/tenderness.  Musculoskeletal:        General: No swelling.  Skin:    General: Skin is warm and dry.  Findings: No rash.  Neurological:     Mental Status: He is alert.     Comments: Alert, speech clear.   Psychiatric:        Mood and Affect: Mood normal.     ED Results / Procedures / Treatments   Labs (all labs ordered are listed, but only abnormal results are displayed) Labs Reviewed - No data to display  EKG None  Radiology No results found.  Procedures .Marland KitchenIncision and Drainage  Date/Time: 06/25/2023 12:49 PM  Performed by: Cathren Laine, MD Authorized by: Cathren Laine, MD   Consent:    Consent given by:  Patient Location:    Type:  Abscess   Location:  Anogenital   Anogenital location:  Scrotal wall Pre-procedure details:    Skin preparation:  Chlorhexidine with alcohol Anesthesia:    Anesthesia method:  Local infiltration   Local  anesthetic:  Lidocaine 2% WITH epi Procedure type:    Complexity:  Complex Procedure details:    Incision types:  Single straight   Incision depth:  Subcutaneous   Wound management:  Probed and deloculated and irrigated with saline   Drainage:  Purulent   Drainage amount:  Moderate   Wound treatment:  Wound left open   Packing materials:  1/4 in iodoform gauze Post-procedure details:    Procedure completion:  Tolerated well, no immediate complications Comments:     Sterile dressing     Medications Ordered in ED Medications  lidocaine-EPINEPHrine (XYLOCAINE W/EPI) 2 %-1:200000 (PF) injection 20 mL (20 mLs Infiltration Given 06/25/23 1239)  acetaminophen (TYLENOL) tablet 1,000 mg (1,000 mg Oral Given 06/25/23 1244)  ibuprofen (ADVIL) tablet 400 mg (400 mg Oral Given 06/25/23 1244)  doxycycline (VIBRA-TABS) tablet 100 mg (100 mg Oral Given 06/25/23 1244)    ED Course/ Medical Decision Making/ A&P                                 Medical Decision Making Problems Addressed: Scrotum, abscess: acute illness or injury  Amount and/or Complexity of Data Reviewed External Data Reviewed: notes.  Risk OTC drugs. Prescription drug management.   Exam c/w abscess.   Reviewed nursing notes and prior charts for additional history.   I and D abscess. Acetaminophen po, ibuprofen po. Doxy po. Word note provided.   Abx rx.   Return precautions provided.        Final Clinical Impression(s) / ED Diagnoses Final diagnoses:  Scrotum, abscess    Rx / DC Orders ED Discharge Orders          Ordered    doxycycline (VIBRAMYCIN) 100 MG capsule  2 times daily        06/25/23 1242              Cathren Laine, MD 06/25/23 1251

## 2023-09-28 ENCOUNTER — Emergency Department (HOSPITAL_COMMUNITY): Payer: Medicaid Other

## 2023-09-28 ENCOUNTER — Emergency Department (HOSPITAL_COMMUNITY)
Admission: EM | Admit: 2023-09-28 | Discharge: 2023-09-28 | Disposition: A | Discharge status: Home / Self Care | Payer: Medicaid Other

## 2023-09-28 ENCOUNTER — Encounter (HOSPITAL_COMMUNITY): Payer: Self-pay

## 2023-09-28 ENCOUNTER — Other Ambulatory Visit: Payer: Self-pay

## 2023-09-28 DIAGNOSIS — S161XXA Strain of muscle, fascia and tendon at neck level, initial encounter: Secondary | ICD-10-CM | POA: Insufficient documentation

## 2023-09-28 DIAGNOSIS — M542 Cervicalgia: Secondary | ICD-10-CM | POA: Diagnosis present

## 2023-09-28 DIAGNOSIS — Y92410 Unspecified street and highway as the place of occurrence of the external cause: Secondary | ICD-10-CM | POA: Diagnosis not present

## 2023-09-28 MED ORDER — LIDOCAINE 4 % EX PTCH: 1.0000 | MEDICATED_PATCH | CUTANEOUS | 0 refills | Status: AC

## 2023-09-28 MED ORDER — METHOCARBAMOL 500 MG PO TABS: 500.0000 mg | ORAL_TABLET | Freq: Two times a day (BID) | ORAL | 0 refills | Status: AC | PRN
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
## 2023-09-28 NOTE — ED Notes (Signed)
 Pt provided discharge instructions and prescription information. Pt was given the opportunity to ask questions and questions were answered.

## 2023-09-28 NOTE — Discharge Instructions (Signed)
 As discussed, please take Tylenol  alternating with ibuprofen  with dosages as directed on the packaging for baseline pain.  We are prescribing lidocaine  patches and muscle relaxer.  Do not drive or operate heavy machinery while taking muscle relaxer.  Please follow-up with your primary doctor.  Return immediately Onset Headache, Chest Pain, Shortness of Breath, Abdominal Pain, Nausea Vomiting Diarrhea or Any New or Worsening Symptoms That Are concerning to You.
